# Patient Record
Sex: Female | Born: 1980 | Race: White | Hispanic: No | Marital: Married | Smoking: Never smoker
Health system: Southern US, Community
[De-identification: ages and names within clinical notes are randomized; demographics above are authoritative.]

## PROBLEM LIST (undated history)

## (undated) DIAGNOSIS — R519 Headache, unspecified: Secondary | ICD-10-CM

## (undated) DIAGNOSIS — G43909 Migraine, unspecified, not intractable, without status migrainosus: Secondary | ICD-10-CM

## (undated) DIAGNOSIS — G8929 Other chronic pain: Secondary | ICD-10-CM

## (undated) DIAGNOSIS — D649 Anemia, unspecified: Secondary | ICD-10-CM

## (undated) DIAGNOSIS — T4145XA Adverse effect of unspecified anesthetic, initial encounter: Secondary | ICD-10-CM

## (undated) DIAGNOSIS — F419 Anxiety disorder, unspecified: Secondary | ICD-10-CM

## (undated) DIAGNOSIS — M545 Low back pain, unspecified: Secondary | ICD-10-CM

## (undated) DIAGNOSIS — J189 Pneumonia, unspecified organism: Secondary | ICD-10-CM

## (undated) DIAGNOSIS — R51 Headache: Secondary | ICD-10-CM

## (undated) DIAGNOSIS — I10 Essential (primary) hypertension: Secondary | ICD-10-CM

## (undated) DIAGNOSIS — T8859XA Other complications of anesthesia, initial encounter: Secondary | ICD-10-CM

## (undated) HISTORY — PX: REFRACTIVE SURGERY: SHX103

---

## 2015-03-13 ENCOUNTER — Emergency Department: Admission: EM | Admit: 2015-03-13 | Discharge: 2015-03-13 | Payer: 59 | Source: Home / Self Care

## 2015-03-13 ENCOUNTER — Encounter (HOSPITAL_BASED_OUTPATIENT_CLINIC_OR_DEPARTMENT_OTHER): Payer: Self-pay

## 2015-03-13 ENCOUNTER — Emergency Department (HOSPITAL_BASED_OUTPATIENT_CLINIC_OR_DEPARTMENT_OTHER): Payer: 59

## 2015-03-13 ENCOUNTER — Inpatient Hospital Stay (HOSPITAL_BASED_OUTPATIENT_CLINIC_OR_DEPARTMENT_OTHER)
Admission: EM | Admit: 2015-03-13 | Discharge: 2015-03-16 | DRG: 871 | Disposition: A | Discharge status: Home / Self Care | Payer: 59 | Attending: Internal Medicine | Admitting: Internal Medicine

## 2015-03-13 DIAGNOSIS — R1110 Vomiting, unspecified: Secondary | ICD-10-CM

## 2015-03-13 DIAGNOSIS — Z882 Allergy status to sulfonamides status: Secondary | ICD-10-CM

## 2015-03-13 DIAGNOSIS — A419 Sepsis, unspecified organism: Secondary | ICD-10-CM | POA: Diagnosis present

## 2015-03-13 DIAGNOSIS — J189 Pneumonia, unspecified organism: Secondary | ICD-10-CM | POA: Diagnosis present

## 2015-03-13 DIAGNOSIS — J159 Unspecified bacterial pneumonia: Secondary | ICD-10-CM | POA: Diagnosis present

## 2015-03-13 DIAGNOSIS — I10 Essential (primary) hypertension: Secondary | ICD-10-CM | POA: Diagnosis present

## 2015-03-13 DIAGNOSIS — R197 Diarrhea, unspecified: Secondary | ICD-10-CM | POA: Diagnosis present

## 2015-03-13 DIAGNOSIS — E876 Hypokalemia: Secondary | ICD-10-CM | POA: Diagnosis not present

## 2015-03-13 DIAGNOSIS — Z79899 Other long term (current) drug therapy: Secondary | ICD-10-CM

## 2015-03-13 DIAGNOSIS — B349 Viral infection, unspecified: Secondary | ICD-10-CM | POA: Diagnosis present

## 2015-03-13 DIAGNOSIS — R112 Nausea with vomiting, unspecified: Secondary | ICD-10-CM | POA: Diagnosis present

## 2015-03-13 HISTORY — DX: Headache: R51

## 2015-03-13 HISTORY — DX: Anxiety disorder, unspecified: F41.9

## 2015-03-13 HISTORY — DX: Other complications of anesthesia, initial encounter: T88.59XA

## 2015-03-13 HISTORY — DX: Pneumonia, unspecified organism: J18.9

## 2015-03-13 HISTORY — DX: Migraine, unspecified, not intractable, without status migrainosus: G43.909

## 2015-03-13 HISTORY — DX: Low back pain, unspecified: M54.50

## 2015-03-13 HISTORY — DX: Headache, unspecified: R51.9

## 2015-03-13 HISTORY — DX: Low back pain: M54.5

## 2015-03-13 HISTORY — DX: Essential (primary) hypertension: I10

## 2015-03-13 HISTORY — DX: Adverse effect of unspecified anesthetic, initial encounter: T41.45XA

## 2015-03-13 HISTORY — DX: Anemia, unspecified: D64.9

## 2015-03-13 HISTORY — DX: Other chronic pain: G89.29

## 2015-03-13 LAB — COMPREHENSIVE METABOLIC PANEL
ALT: 22 U/L (ref 14–54)
AST: 47 U/L — ABNORMAL HIGH (ref 15–41)
Albumin: 3.7 g/dL (ref 3.5–5.0)
Alkaline Phosphatase: 85 U/L (ref 38–126)
Anion gap: 13 (ref 5–15)
BUN: 13 mg/dL (ref 6–20)
CO2: 23 mmol/L (ref 22–32)
CREATININE: 0.94 mg/dL (ref 0.44–1.00)
Calcium: 9 mg/dL (ref 8.9–10.3)
Chloride: 101 mmol/L (ref 101–111)
GFR calc Af Amer: >60 mL/min (ref >60)
GFR calc non Af Amer: >60 mL/min (ref >60)
GLUCOSE: 107 mg/dL — AB (ref 65–99)
POTASSIUM: 3.1 mmol/L — AB (ref 3.5–5.1)
SODIUM: 137 mmol/L (ref 135–145)
Total Bilirubin: 0.4 mg/dL (ref 0.3–1.2)
Total Protein: 8 g/dL (ref 6.5–8.1)

## 2015-03-13 LAB — CBC WITH DIFFERENTIAL/PLATELET
Basophils Absolute: 0 10*3/uL (ref 0.0–0.1)
Basophils Relative: 0 % (ref 0–1)
Eosinophils Absolute: 0 10*3/uL (ref 0.0–0.7)
Eosinophils Relative: 0 % (ref 0–5)
HEMATOCRIT: 41.6 % (ref 36.0–46.0)
Hemoglobin: 13.6 g/dL (ref 12.0–15.0)
LYMPHS PCT: 15 % (ref 12–46)
Lymphs Abs: 1.2 10*3/uL (ref 0.7–4.0)
MCH: 29.4 pg (ref 26.0–34.0)
MCHC: 32.7 g/dL (ref 30.0–36.0)
MCV: 90 fL (ref 78.0–100.0)
MONOS PCT: 6 % (ref 3–12)
Monocytes Absolute: 0.5 10*3/uL (ref 0.1–1.0)
Neutro Abs: 6.3 10*3/uL (ref 1.7–7.7)
Neutrophils Relative %: 79 % — ABNORMAL HIGH (ref 43–77)
PLATELETS: 331 10*3/uL (ref 150–400)
RBC: 4.62 MIL/uL (ref 3.87–5.11)
RDW: 13.5 % (ref 11.5–15.5)
WBC: 8 10*3/uL (ref 4.0–10.5)

## 2015-03-13 LAB — URINALYSIS, ROUTINE W REFLEX MICROSCOPIC
Bilirubin Urine: NEGATIVE
Glucose, UA: NEGATIVE mg/dL
Hgb urine dipstick: NEGATIVE
Ketones, ur: NEGATIVE mg/dL
Leukocytes, UA: NEGATIVE
Nitrite: NEGATIVE
PROTEIN: 30 mg/dL — AB
SPECIFIC GRAVITY, URINE: 1.013 (ref 1.005–1.030)
Urobilinogen, UA: 0.2 mg/dL (ref 0.0–1.0)
pH: 6.5 (ref 5.0–8.0)

## 2015-03-13 LAB — URINE MICROSCOPIC-ADD ON

## 2015-03-13 LAB — I-STAT CG4 LACTIC ACID, ED: LACTIC ACID, VENOUS: 1.76 mmol/L (ref 0.5–2.0)

## 2015-03-13 LAB — PREGNANCY, URINE: PREG TEST UR: NEGATIVE

## 2015-03-13 LAB — LIPASE, BLOOD: Lipase: 11 U/L — ABNORMAL LOW (ref 22–51)

## 2015-03-13 MED ORDER — SODIUM CHLORIDE 0.9 % IV SOLN
1000.0000 mL | INTRAVENOUS | Status: DC
  Administered 2015-03-13 – 2015-03-14 (×2): 1000 mL via INTRAVENOUS

## 2015-03-13 MED ORDER — DEXTROSE 5 % IV SOLN
1.0000 g | Freq: Once | INTRAVENOUS | Status: AC
  Administered 2015-03-13: 1 g via INTRAVENOUS

## 2015-03-13 MED ORDER — PROMETHAZINE HCL 25 MG/ML IJ SOLN
12.5000 mg | Freq: Once | INTRAMUSCULAR | Status: AC
  Administered 2015-03-13: 12.5 mg via INTRAVENOUS
  Filled 2015-03-13: qty 1

## 2015-03-13 MED ORDER — ONDANSETRON HCL 4 MG/2ML IJ SOLN: 4.0000 mg | Freq: Once | INTRAMUSCULAR | Status: AC

## 2015-03-13 MED ORDER — CARISOPRODOL 350 MG PO TABS: 350.0000 mg | ORAL_TABLET | Freq: Four times a day (QID) | ORAL | Status: DC | PRN

## 2015-03-13 MED ORDER — HEPARIN SODIUM (PORCINE) 5000 UNIT/ML IJ SOLN
5000.0000 [IU] | Freq: Three times a day (TID) | INTRAMUSCULAR | Status: DC
Start: 2015-03-13 — End: 2015-03-16
  Administered 2015-03-13 – 2015-03-16 (×8): 5000 [IU] via SUBCUTANEOUS
  Filled 2015-03-13 (×7): qty 1

## 2015-03-13 MED ORDER — SERTRALINE HCL 100 MG PO TABS
100.0000 mg | ORAL_TABLET | Freq: Every day | ORAL | Status: DC
  Administered 2015-03-14 – 2015-03-15 (×2): 100 mg via ORAL
  Filled 2015-03-13 (×4): qty 1

## 2015-03-13 MED ORDER — PROPRANOLOL HCL 10 MG PO TABS
10.0000 mg | ORAL_TABLET | Freq: Three times a day (TID) | ORAL | Status: DC
  Administered 2015-03-13 – 2015-03-15 (×7): 10 mg via ORAL
  Filled 2015-03-13 (×13): qty 1

## 2015-03-13 MED ORDER — ONDANSETRON HCL 4 MG/2ML IJ SOLN
4.0000 mg | Freq: Four times a day (QID) | INTRAMUSCULAR | Status: DC | PRN
  Administered 2015-03-13 – 2015-03-14 (×2): 4 mg via INTRAVENOUS
  Filled 2015-03-13 (×2): qty 2

## 2015-03-13 MED ORDER — SODIUM CHLORIDE 0.9 % IV SOLN
1000.0000 mL | Freq: Once | INTRAVENOUS | Status: AC
  Administered 2015-03-13: 1000 mL via INTRAVENOUS

## 2015-03-13 MED ORDER — AZITHROMYCIN 500 MG IV SOLR
INTRAVENOUS | Status: AC
Start: 2015-03-13 — End: 2015-03-13
  Filled 2015-03-13: qty 500

## 2015-03-13 MED ORDER — DEXTROSE 5 % IV SOLN
500.0000 mg | Freq: Once | INTRAVENOUS | Status: AC
  Administered 2015-03-13: 500 mg via INTRAVENOUS

## 2015-03-13 MED ORDER — ACETAMINOPHEN 500 MG PO TABS
1000.0000 mg | ORAL_TABLET | Freq: Once | ORAL | Status: AC
  Administered 2015-03-13: 1000 mg via ORAL
  Filled 2015-03-13: qty 2

## 2015-03-13 MED ORDER — CEFTRIAXONE SODIUM 1 G IJ SOLR
INTRAMUSCULAR | Status: AC
  Administered 2015-03-13: 1000 mg
  Filled 2015-03-13: qty 10

## 2015-03-13 MED ORDER — CEFTRIAXONE SODIUM IN DEXTROSE 20 MG/ML IV SOLN
1.0000 g | Freq: Once | INTRAVENOUS | Status: AC
  Administered 2015-03-14: 1 g via INTRAVENOUS
  Filled 2015-03-13: qty 50

## 2015-03-13 MED ORDER — SODIUM CHLORIDE 0.9 % IV SOLN
1000.0000 mL | Freq: Once | INTRAVENOUS | Status: AC
Start: 2015-03-13 — End: 2015-03-13
  Administered 2015-03-13: 1000 mL via INTRAVENOUS

## 2015-03-13 MED ORDER — ELETRIPTAN HYDROBROMIDE 20 MG PO TABS
20.0000 mg | ORAL_TABLET | ORAL | Status: DC | PRN
  Administered 2015-03-14 – 2015-03-16 (×2): 20 mg via ORAL
  Filled 2015-03-13 (×4): qty 1

## 2015-03-13 MED ORDER — PROMETHAZINE HCL 25 MG/ML IJ SOLN
25.0000 mg | Freq: Four times a day (QID) | INTRAMUSCULAR | Status: DC | PRN
Start: 2015-03-13 — End: 2015-03-13

## 2015-03-13 MED ORDER — ONDANSETRON HCL 4 MG/2ML IJ SOLN
4.0000 mg | Freq: Once | INTRAMUSCULAR | Status: AC
Start: 2015-03-13 — End: 2015-03-13
  Administered 2015-03-13: 4 mg via INTRAVENOUS
  Filled 2015-03-13: qty 2

## 2015-03-13 MED ORDER — PROMETHAZINE HCL 25 MG PO TABS: 25.0000 mg | ORAL_TABLET | Freq: Four times a day (QID) | ORAL | Status: DC | PRN

## 2015-03-13 MED ORDER — ACETAMINOPHEN 325 MG PO TABS
650.0000 mg | ORAL_TABLET | Freq: Four times a day (QID) | ORAL | Status: DC | PRN
  Administered 2015-03-13 – 2015-03-14 (×3): 650 mg via ORAL
  Filled 2015-03-13 (×3): qty 2

## 2015-03-13 MED ORDER — DEXTROSE 5 % IV SOLN
500.0000 mg | INTRAVENOUS | Status: DC
  Administered 2015-03-14 – 2015-03-15 (×2): 500 mg via INTRAVENOUS
  Filled 2015-03-13 (×2): qty 500

## 2015-03-13 MED ORDER — ONDANSETRON HCL 4 MG/2ML IJ SOLN
INTRAMUSCULAR | Status: AC
  Filled 2015-03-13: qty 2

## 2015-03-13 NOTE — H&P (Signed)
[facility] History and Physical  Helen Sanchez JXB:[id number] DOB: [birth year] DOA: 03/13/2015  Referring physician: EDP PCP: No PCP Per Patient   Chief Complaint: Viral syndrome   HPI: Helen Sanchez is a 34 y.o. female who presents to the ED with c/o cough, fever, chills, N/V/D, and fall.  Patient has been feeling ill for past 4 days.  Her husband and child are also ill with similar symptoms.  Her family just returned from visiting relatives up in [place] who also were all ill as well.  Having numerous episodes of vomiting and diarrhea.  Dosent think any of this is food related as her son who is also ill dosent eat same foods as she and husband.  Review of Systems: Systems reviewed.  As above, otherwise negative  Past Medical History  Diagnosis Date  . Migraine   . Hypertension    Past Surgical History  Procedure Laterality Date  . Refractive surgery     Social History:  reports that she has never smoked. She does not have any smokeless tobacco history on file. She reports that she does not drink alcohol or use illicit drugs.  Allergies  Allergen Reactions  . Sulfa Antibiotics Rash    No family history on file.   Prior to Admission medications   Medication Sig Start Date End Date Taking? Authorizing Provider  carisoprodol (SOMA) 350 MG tablet Take 350 mg by mouth 4 (four) times daily as needed for muscle spasms.   Yes Historical Provider, MD  eletriptan (RELPAX) 20 MG tablet Take 20 mg by mouth as needed for migraine or headache. May repeat in 2 hours if headache persists or recurs.   Yes Historical Provider, MD  promethazine (PHENERGAN) 25 MG tablet Take 25 mg by mouth every 6 (six) hours as needed for nausea or vomiting.   Yes Historical Provider, MD  propranolol (INDERAL) 10 MG tablet Take 10 mg by mouth 3 (three) times daily.   Yes Historical Provider, MD  sertraline (ZOLOFT) 100 MG tablet Take 100 mg by mouth daily.   Yes Historical Provider, MD   Physical  Exam: Filed Vitals:   03/13/15 2117  BP: 126/77  Pulse: 118  Temp: 99.8 F (37.7 C)  Resp: 18    BP 126/77 mmHg  Pulse 118  Temp(Src) 99.8 F (37.7 C) (Oral)  Resp 18  Ht 5\' 3"  (1.6 m)  Wt 65.7 kg (144 lb 13.5 oz)  BMI 25.66 kg/m2  SpO2 97%  LMP 02/13/2015  General Appearance:    Alert, oriented, no distress, appears stated age  Head:    Normocephalic, atraumatic  Eyes:    PERRL, EOMI, sclera non-icteric        Nose:   Nares without drainage or epistaxis. Mucosa, turbinates normal  Throat:   Moist mucous membranes. Oropharynx without erythema or exudate.  Neck:   Supple. No carotid bruits.  No thyromegaly.  No lymphadenopathy.   Back:     No CVA tenderness, no spinal tenderness  Lungs:     Has cough.  Chest wall:    No tenderness to palpitation  Heart:    Tachycardia  Abdomen:     Soft, non-tender, nondistended, normal bowel sounds, no organomegaly  Genitalia:    deferred  Rectal:    deferred  Extremities:   No clubbing, cyanosis or edema.  Pulses:   2+ and symmetric all extremities  Skin:   Urticarial rash on chest and back.  Lymph nodes:   Cervical, supraclavicular, and axillary nodes normal  Neurologic:   CNII-XII intact. Normal strength, sensation and reflexes      throughout    Labs on Admission:  Basic Metabolic Panel:  Recent Labs Lab 03/13/15 1630  NA 137  K 3.1*  CL 101  CO2 23  GLUCOSE 107*  BUN 13  CREATININE 0.94  CALCIUM 9.0   Liver Function Tests:  Recent Labs Lab 03/13/15 1630  AST 47*  ALT 22  ALKPHOS 85  BILITOT 0.4  PROT 8.0  ALBUMIN 3.7    Recent Labs Lab 03/13/15 1630  LIPASE 11*   No results for input(s): AMMONIA in the last 168 hours. CBC:  Recent Labs Lab 03/13/15 1630  WBC 8.0  NEUTROABS 6.3  HGB 13.6  HCT 41.6  MCV 90.0  PLT 331   Cardiac Enzymes: No results for input(s): CKTOTAL, CKMB, CKMBINDEX, TROPONINI in the last 168 hours.  BNP (last 3 results) No results for input(s): PROBNP in the last 8760  hours. CBG: No results for input(s): GLUCAP in the last 168 hours.  Radiological Exams on Admission: Dg Chest 2 View  03/13/2015   CLINICAL DATA:  Productive cough and congestion.  Fever.  EXAM: CHEST  2 VIEW  COMPARISON:  None.  FINDINGS: There is consolidation in portions of the anterior segment of the right upper lobe and in the superior segment right lower lobe. Lungs elsewhere clear. Heart size and pulmonary vascularity are normal. No adenopathy. No bone lesions.  IMPRESSION: Areas of airspace consolidation consistent with pneumonia in the anterior segment right upper lobe and in the superior segment right lower lobe.   Electronically Signed   By: Bretta Bang III M.D.   On: 03/13/2015 17:17    EKG: Independently reviewed.  Assessment/Plan Principal Problem:   CAP (community acquired pneumonia) Active Problems:   Nausea vomiting and diarrhea   Viral syndrome   Sepsis   1. CAP - could be part of the viral syndrome but will assume that this is a secondary bacterial PNA in absence of evidence. 1. PNA pathway 2. Cultures pending 3. Respiratory virus panel pending 4. Rocephin and azithromycin empirically 2. Sepsis - 1. ABx as above 2. IVF 3. N/V/D 1. IVF 2. zofran for nausea    Code Status: Full  Family Communication: No family in room Disposition Plan: Admit to inpatient   Time spent: 70 min  GARDNER, JARED M. [facility] Pager [phone]  If 7AM-7PM, please contact the day team taking care of the patient [email or web address] Password [facility] 03/13/2015, 9:37 PM

## 2015-03-13 NOTE — ED Notes (Signed)
Pt reports 4 days ago tripped and fell into the corner of a dresser, states ? Loc b/c she does not remember husband picking her up.  States since this time with fever, n/v/d.  Reports has lost voice d/t vomiting.

## 2015-03-13 NOTE — ED Provider Notes (Signed)
CSN: [id number]     Arrival date & time 03/13/15  1456 History   First MD Initiated Contact with Patient 03/13/15 1545     Chief Complaint  Patient presents with  . Fall     (Consider location/radiation/quality/duration/timing/severity/associated sxs/prior Treatment) HPI Patient reports that she went to the urgent care because of flu symptoms. She reports while there she reported she had fallen 4 days ago and hit her head on the corner of a dresser. She reports she wanted to make sure that the cut she has on her forehead was okay. She reports a advised her she needed to come to the emergency department for further assessment. The fall from 4 days ago occurred due to a mechanical fall with tripping and hitting her head. She states that she might have had a brief loss of consciousness because she can't exactly recall her husband picking her up, however her mental status was normal after that and she knew all of her surroundings. She denies she's had significant headache. She reports she did not have concern for serious injury associated with that fall, she just wanted to make sure that her forehead was healing appropriately. She states that her main concern is that she's been sick with flulike symptoms for 4 days now. She assumed it was a viral flu illness because both her husband and her child have been ill as well. She states she's had multiple episodes of vomiting for the past 4 days. It seems to get worse at nighttime. She also states upwards of 4 episodes of diarrhea per day as well. She states she's had fever and association with this. She reports her voice is becoming hoarse from vomiting so much. She states she is still vomiting anywhere from 10-20 times a day since the onset of the illness. She denies associated abdominal pain. She did not think it was food related. She states her son does not eat any of the same foods that they do so she thought it was more likely to be a virus. Past Medical  History  Diagnosis Date  . Migraine   . Hypertension    Past Surgical History  Procedure Laterality Date  . Refractive surgery     No family history on file. History  Substance Use Topics  . Smoking status: Never Smoker   . Smokeless tobacco: Not on file  . Alcohol Use: No   OB History    No data available     Review of Systems 10 Systems reviewed and are negative for acute change except as noted in the HPI.    Allergies  Sulfa antibiotics  Home Medications   Prior to Admission medications   Medication Sig Start Date End Date Taking? Authorizing Provider  carisoprodol (SOMA) 350 MG tablet Take 350 mg by mouth 4 (four) times daily as needed for muscle spasms.   Yes Historical Provider, MD  eletriptan (RELPAX) 20 MG tablet Take 20 mg by mouth as needed for migraine or headache. May repeat in 2 hours if headache persists or recurs.   Yes Historical Provider, MD  promethazine (PHENERGAN) 25 MG tablet Take 25 mg by mouth every 6 (six) hours as needed for nausea or vomiting.   Yes Historical Provider, MD  propranolol (INDERAL) 10 MG tablet Take 10 mg by mouth 3 (three) times daily.   Yes Historical Provider, MD  sertraline (ZOLOFT) 100 MG tablet Take 100 mg by mouth daily.   Yes Historical Provider, MD   BP 113/61 mmHg  Pulse 126  Temp(Src) 102.3 F (39.1 C) (Oral)  Resp 18  Ht 5\' 3"  (1.6 m)  Wt 135 lb (61.236 kg)  BMI 23.92 kg/m2  SpO2 96%  LMP 02/13/2015 Physical Exam  Constitutional: She is oriented to person, place, and time. She appears well-developed and well-nourished.  Patient is alert and appropriate, GCS is 15. She appears mildly uncomfortable but is nontoxic and in no respiratory distress. She does have occasional cough.  HENT:  1 cm healing laceration to the right forehead that has a small flap appearance to it. Edges are well approximated. Associated erythema or swelling to suggest infection. No associated hematoma. Diffuse erythema tonsillar pillars. No  exudate.  Eyes: EOM are normal. Pupils are equal, round, and reactive to light. Right eye exhibits no discharge. Left eye exhibits no discharge. No scleral icterus.  Neck: Neck supple.  Cardiovascular: Regular rhythm, normal heart sounds and intact distal pulses.   Tachycardia.  Pulmonary/Chest: Effort normal and breath sounds normal. No stridor. No respiratory distress. She has no wheezes. She has no rales.  With deep inspiration patient has cough production.  Abdominal: Soft. Bowel sounds are normal. She exhibits no distension. There is no tenderness.  Musculoskeletal: Normal range of motion. She exhibits no edema or tenderness.  Lymphadenopathy:    She has no cervical adenopathy.  Neurological: She is alert and oriented to person, place, and time. She has normal strength. Coordination normal. GCS eye subscore is 4. GCS verbal subscore is 5. GCS motor subscore is 6.  Skin: Skin is warm, dry and intact.  Patient has an urticarial appearing rash on her chest and upper back. No other rash present.  Psychiatric: She has a normal mood and affect.    ED Course  Procedures (including critical care time) Labs Review Labs Reviewed  URINALYSIS, ROUTINE W REFLEX MICROSCOPIC (NOT AT [facility]) - Abnormal; Notable for the following:    Protein, ur 30 (*)    All other components within normal limits  URINE MICROSCOPIC-ADD ON - Abnormal; Notable for the following:    Bacteria, UA MANY (*)    All other components within normal limits  COMPREHENSIVE METABOLIC PANEL - Abnormal; Notable for the following:    Potassium 3.1 (*)    Glucose, Bld 107 (*)    AST 47 (*)    All other components within normal limits  LIPASE, BLOOD - Abnormal; Notable for the following:    Lipase 11 (*)    All other components within normal limits  CBC WITH DIFFERENTIAL/PLATELET - Abnormal; Notable for the following:    Neutrophils Relative % 79 (*)    All other components within normal limits  CULTURE, BLOOD (ROUTINE X 2)   CULTURE, BLOOD (ROUTINE X 2)  PREGNANCY, URINE  GI PATHOGEN PANEL BY PCR, STOOL  I-STAT CG4 LACTIC ACID, ED    Imaging Review Dg Chest 2 View  03/13/2015   CLINICAL DATA:  Productive cough and congestion.  Fever.  EXAM: CHEST  2 VIEW  COMPARISON:  None.  FINDINGS: There is consolidation in portions of the anterior segment of the right upper lobe and in the superior segment right lower lobe. Lungs elsewhere clear. Heart size and pulmonary vascularity are normal. No adenopathy. No bone lesions.  IMPRESSION: Areas of airspace consolidation consistent with pneumonia in the anterior segment right upper lobe and in the superior segment right lower lobe.   Electronically Signed   By: Bretta Bang III M.D.   On: 03/13/2015 17:17  EKG Interpretation None      MDM   Final diagnoses:  Community acquired pneumonia  Intractable vomiting with nausea, vomiting of unspecified type   Patient presents with vomiting for 4 days duration. She has also had fever. During physical examination she was noted to have harsh cough as well. Chest x-ray shows a consolidated area of pneumonia. At this time with ongoing vomiting, tachycardia and fever the patient will be admitted for inpatient treatment.    Arby Barrette, MD 03/13/15 [phone]

## 2015-03-13 NOTE — ED Notes (Signed)
Pt is nauseated.

## 2015-03-13 NOTE — ED Notes (Signed)
Encouraged pt. If she needs to use restroom for BM to call RN due to need for Stool sample

## 2015-03-14 DIAGNOSIS — R1110 Vomiting, unspecified: Secondary | ICD-10-CM

## 2015-03-14 DIAGNOSIS — R112 Nausea with vomiting, unspecified: Secondary | ICD-10-CM | POA: Insufficient documentation

## 2015-03-14 DIAGNOSIS — E876 Hypokalemia: Secondary | ICD-10-CM | POA: Diagnosis present

## 2015-03-14 DIAGNOSIS — A419 Sepsis, unspecified organism: Principal | ICD-10-CM

## 2015-03-14 DIAGNOSIS — J189 Pneumonia, unspecified organism: Secondary | ICD-10-CM

## 2015-03-14 LAB — CBC WITH DIFFERENTIAL/PLATELET
BASOS ABS: 0 10*3/uL (ref 0.0–0.1)
Basophils Relative: 0 % (ref 0–1)
Eosinophils Absolute: 0 10*3/uL (ref 0.0–0.7)
Eosinophils Relative: 0 % (ref 0–5)
HCT: 36 % (ref 36.0–46.0)
HEMOGLOBIN: 11.5 g/dL — AB (ref 12.0–15.0)
LYMPHS ABS: 1.3 10*3/uL (ref 0.7–4.0)
Lymphocytes Relative: 32 % (ref 12–46)
MCH: 28.8 pg (ref 26.0–34.0)
MCHC: 31.9 g/dL (ref 30.0–36.0)
MCV: 90 fL (ref 78.0–100.0)
Monocytes Absolute: 0.3 10*3/uL (ref 0.1–1.0)
Monocytes Relative: 7 % (ref 3–12)
NEUTROS ABS: 2.5 10*3/uL (ref 1.7–7.7)
Neutrophils Relative %: 61 % (ref 43–77)
Platelets: 302 10*3/uL (ref 150–400)
RBC: 4 MIL/uL (ref 3.87–5.11)
RDW: 13.7 % (ref 11.5–15.5)
WBC: 4.1 10*3/uL (ref 4.0–10.5)

## 2015-03-14 LAB — MAGNESIUM: Magnesium: 1.7 mg/dL (ref 1.7–2.4)

## 2015-03-14 LAB — COMPREHENSIVE METABOLIC PANEL
ALBUMIN: 2.5 g/dL — AB (ref 3.5–5.0)
ALT: 13 U/L — ABNORMAL LOW (ref 14–54)
AST: 28 U/L (ref 15–41)
Alkaline Phosphatase: 63 U/L (ref 38–126)
Anion gap: 8 (ref 5–15)
BILIRUBIN TOTAL: 0.3 mg/dL (ref 0.3–1.2)
BUN: 7 mg/dL (ref 6–20)
CO2: 19 mmol/L — ABNORMAL LOW (ref 22–32)
CREATININE: 0.95 mg/dL (ref 0.44–1.00)
Calcium: 7.5 mg/dL — ABNORMAL LOW (ref 8.9–10.3)
Chloride: 112 mmol/L — ABNORMAL HIGH (ref 101–111)
GFR calc Af Amer: >60 mL/min (ref >60)
GFR calc non Af Amer: >60 mL/min (ref >60)
Glucose, Bld: 135 mg/dL — ABNORMAL HIGH (ref 65–99)
POTASSIUM: 2.2 mmol/L — AB (ref 3.5–5.1)
Sodium: 139 mmol/L (ref 135–145)
Total Protein: 5.7 g/dL — ABNORMAL LOW (ref 6.5–8.1)

## 2015-03-14 LAB — INFLUENZA PANEL BY PCR (TYPE A & B)
H1N1FLUPCR: NOT DETECTED
INFLBPCR: NEGATIVE
Influenza A By PCR: NEGATIVE

## 2015-03-14 LAB — PROCALCITONIN: Procalcitonin: 5.98 ng/mL

## 2015-03-14 LAB — APTT: aPTT: 43 seconds — ABNORMAL HIGH (ref 24–37)

## 2015-03-14 LAB — PROTIME-INR
INR: 1.14 (ref 0.00–1.49)
Prothrombin Time: 14.8 seconds (ref 11.6–15.2)

## 2015-03-14 LAB — LACTIC ACID, PLASMA
Lactic Acid, Venous: 0.8 mmol/L (ref 0.5–2.0)
Lactic Acid, Venous: 1.6 mmol/L (ref 0.5–2.0)

## 2015-03-14 LAB — STREP PNEUMONIAE URINARY ANTIGEN: STREP PNEUMO URINARY ANTIGEN: NEGATIVE

## 2015-03-14 LAB — HIV ANTIBODY (ROUTINE TESTING W REFLEX): HIV Screen 4th Generation wRfx: NONREACTIVE

## 2015-03-14 MED ORDER — SODIUM CHLORIDE 0.9 % IV SOLN
INTRAVENOUS | Status: DC
  Administered 2015-03-14: 12:00:00 via INTRAVENOUS
  Filled 2015-03-14 (×5): qty 1000

## 2015-03-14 MED ORDER — MAGNESIUM SULFATE 2 GM/50ML IV SOLN
2.0000 g | Freq: Once | INTRAVENOUS | Status: AC
  Administered 2015-03-14: 2 g via INTRAVENOUS
  Filled 2015-03-14: qty 50

## 2015-03-14 MED ORDER — SODIUM CHLORIDE 0.9 % IV BOLUS (SEPSIS): 1000.0000 mL | INTRAVENOUS | Status: AC

## 2015-03-14 MED ORDER — GUAIFENESIN ER 600 MG PO TB12
1200.0000 mg | ORAL_TABLET | Freq: Two times a day (BID) | ORAL | Status: DC
  Administered 2015-03-14 – 2015-03-16 (×4): 1200 mg via ORAL
  Filled 2015-03-14 (×4): qty 2

## 2015-03-14 MED ORDER — LEVALBUTEROL HCL 0.63 MG/3ML IN NEBU: 0.6300 mg | INHALATION_SOLUTION | RESPIRATORY_TRACT | Status: DC | PRN

## 2015-03-14 MED ORDER — LEVALBUTEROL HCL 0.63 MG/3ML IN NEBU
0.6300 mg | INHALATION_SOLUTION | Freq: Four times a day (QID) | RESPIRATORY_TRACT | Status: DC
  Administered 2015-03-14: 0.63 mg via RESPIRATORY_TRACT
  Filled 2015-03-14: qty 3

## 2015-03-14 MED ORDER — POTASSIUM CHLORIDE CRYS ER 20 MEQ PO TBCR
40.0000 meq | EXTENDED_RELEASE_TABLET | ORAL | Status: AC
  Administered 2015-03-14 (×3): 40 meq via ORAL
  Filled 2015-03-14 (×3): qty 2

## 2015-03-14 NOTE — Progress Notes (Signed)
03/14/15 Patient has low K+ at 2.2 informed Physician for orders.

## 2015-03-14 NOTE — Progress Notes (Signed)
[facility] PROGRESS NOTE  Matilyn Fehrman ZOX:[id number] DOB: May 25, 1981 DOA: 03/13/2015 PCP: Marvis Repress, MD  Assessment/Plan: #1 sepsis secondary to community-acquired pneumonia Patient presented septic meeting criteria with fevers as high as 103.2, tachycardic up to 132, with a chest x-ray consistent with right-sided pneumonia. Patient with some clinical improvement. Patient still with fevers. Patient has been pancultured and cultures are pending. Influenza PCR is pending. Continue empiric IV Rocephin, IV azithromycin, oxygen. Will place on Mucinex and bronchodilators.  #2 community acquired pneumonia Sputum Gram stain and cultures pending. Blood cultures are pending. Patient with fevers. Continue oxygen, empiric IV Rocephin and IV azithromycin, oxygen. Will add Mucinex and broncho-dilators. Follow.  #3 hypokalemia Secondary to GI losses. Check a magnesium level. Replete.  #4 nausea vomiting diarrhea Patient with no further diarrhea. Clinical improvement. Continue IV fluids. Continue supportive care.  #5 prophylaxis Heparin for DVT prophylaxis.   Code Status: Full Family Communication: Updated patient. No family at bedside. Disposition Plan: Home when medically stable and afebrile greater than 24-48 hours and tolerating oral intake.   Consultants:  None  Procedures:  Chest x-ray 03/13/2015  Antibiotics:  IV Rocephin 03/13/2015  IV azithromycin 03/13/2015  HPI/Subjective: Patient denies any further emesis. Patient states feeling a little bit better than on admission. Cough improving. Patient with some nausea. Patient tolerating clear liquids.  Objective: Filed Vitals:   03/14/15 0720  BP:   Pulse:   Temp: 98.8 F (37.1 C)  Resp:     Intake/Output Summary (Last 24 hours) at 03/14/15 1208 Last data filed at 03/14/15 0600  Gross per 24 hour  Intake 5682.25 ml  Output   1200 ml  Net 4482.25 ml   Filed Weights   03/13/15 1504 03/13/15 2117  Weight:  61.236 kg (135 lb) 65.7 kg (144 lb 13.5 oz)    Exam:   General:  NAD  Cardiovascular: RRR  Respiratory: Coarse/Rhonchorus BS right greater than left  Abdomen: Soft, nontender, nondistended, positive bowel sounds.  Musculoskeletal: No clubbing cyanosis or edema.  Data Reviewed: Basic Metabolic Panel:  Recent Labs Lab 03/13/15 1630 03/14/15 0926  NA 137 139  K 3.1* 2.2*  CL 101 112*  CO2 23 19*  GLUCOSE 107* 135*  BUN 13 7  CREATININE 0.94 0.95  CALCIUM 9.0 7.5*  MG  --  1.7   Liver Function Tests:  Recent Labs Lab 03/13/15 1630 03/14/15 0926  AST 47* 28  ALT 22 13*  ALKPHOS 85 63  BILITOT 0.4 0.3  PROT 8.0 5.7*  ALBUMIN 3.7 2.5*    Recent Labs Lab 03/13/15 1630  LIPASE 11*   No results for input(s): AMMONIA in the last 168 hours. CBC:  Recent Labs Lab 03/13/15 1630 03/14/15 0926  WBC 8.0 4.1  NEUTROABS 6.3 2.5  HGB 13.6 11.5*  HCT 41.6 36.0  MCV 90.0 90.0  PLT 331 302   Cardiac Enzymes: No results for input(s): CKTOTAL, CKMB, CKMBINDEX, TROPONINI in the last 168 hours. BNP (last 3 results) No results for input(s): BNP in the last 8760 hours.  ProBNP (last 3 results) No results for input(s): PROBNP in the last 8760 hours.  CBG: No results for input(s): GLUCAP in the last 168 hours.  Recent Results (from the past 240 hour(s))  Culture, blood (routine x 2)     Status: None (Preliminary result)   Collection Time: 03/13/15  4:35 PM  Result Value Ref Range Status   Specimen Description BLOOD LEFT [facility]  Final   Special Requests BOTTLES DRAWN AEROBIC AND  ANAEROBIC [facility] EACH  Final   Culture   Final    NO GROWTH < 12 HOURS Performed at [facility]    Report Status PENDING  Incomplete  Culture, blood (routine x 2)     Status: None (Preliminary result)   Collection Time: 03/13/15  6:40 PM  Result Value Ref Range Status   Specimen Description BLOOD RIGHT [facility]  Final   Special Requests BOTTLES DRAWN AEROBIC AND ANAEROBIC 4CC EACH  Final    Culture   Final    NO GROWTH < 12 HOURS Performed at [facility]    Report Status PENDING  Incomplete     Studies: Dg Chest 2 View  03/13/2015   CLINICAL DATA:  Productive cough and congestion.  Fever.  EXAM: CHEST  2 VIEW  COMPARISON:  None.  FINDINGS: There is consolidation in portions of the anterior segment of the right upper lobe and in the superior segment right lower lobe. Lungs elsewhere clear. Heart size and pulmonary vascularity are normal. No adenopathy. No bone lesions.  IMPRESSION: Areas of airspace consolidation consistent with pneumonia in the anterior segment right upper lobe and in the superior segment right lower lobe.   Electronically Signed   By: Bretta Bang III M.D.   On: 03/13/2015 17:17    Scheduled Meds: . azithromycin (ZITHROMAX) 500 MG IVPB  500 mg Intravenous Q24H  . cefTRIAXone (ROCEPHIN)  IV  1 g Intravenous Once  . heparin  5,000 Units Subcutaneous 3 times per day  . magnesium sulfate 1 - 4 g bolus IVPB  2 g Intravenous Once  . potassium chloride  40 mEq Oral Q4H  . propranolol  10 mg Oral TID  . sertraline  100 mg Oral Daily  . sodium chloride  1,000 mL Intravenous Q1H   Continuous Infusions: . sodium chloride 0.9 % 1,000 mL with potassium chloride 40 mEq infusion 125 mL/hr at 03/14/15 1139    Principal Problem:   Sepsis Active Problems:   CAP (community acquired pneumonia)   Nausea vomiting and diarrhea   Viral syndrome   Hypokalemia    Time spent: 40 mins    [facility] MD [facility] Pager [phone]. If 7PM-7AM, please contact night-coverage at [email or web address], password [facility] 03/14/2015, 12:08 PM  LOS: 1 day

## 2015-03-15 DIAGNOSIS — R112 Nausea with vomiting, unspecified: Secondary | ICD-10-CM

## 2015-03-15 DIAGNOSIS — R197 Diarrhea, unspecified: Secondary | ICD-10-CM

## 2015-03-15 LAB — CBC
HCT: 34.1 % — ABNORMAL LOW (ref 36.0–46.0)
Hemoglobin: 10.9 g/dL — ABNORMAL LOW (ref 12.0–15.0)
MCH: 29.3 pg (ref 26.0–34.0)
MCHC: 32 g/dL (ref 30.0–36.0)
MCV: 91.7 fL (ref 78.0–100.0)
Platelets: 312 10*3/uL (ref 150–400)
RBC: 3.72 MIL/uL — ABNORMAL LOW (ref 3.87–5.11)
RDW: 13.8 % (ref 11.5–15.5)
WBC: 2.8 10*3/uL — ABNORMAL LOW (ref 4.0–10.5)

## 2015-03-15 LAB — BASIC METABOLIC PANEL
Anion gap: 5 (ref 5–15)
CALCIUM: 7.6 mg/dL — AB (ref 8.9–10.3)
CO2: 21 mmol/L — ABNORMAL LOW (ref 22–32)
Chloride: 113 mmol/L — ABNORMAL HIGH (ref 101–111)
Creatinine, Ser: 0.77 mg/dL (ref 0.44–1.00)
GFR calc Af Amer: >60 mL/min (ref >60)
GLUCOSE: 94 mg/dL (ref 65–99)
POTASSIUM: 4.9 mmol/L (ref 3.5–5.1)
SODIUM: 139 mmol/L (ref 135–145)

## 2015-03-15 LAB — RESPIRATORY VIRUS PANEL
Adenovirus: NEGATIVE
INFLUENZA B 1: NEGATIVE
Influenza A: NEGATIVE
METAPNEUMOVIRUS: NEGATIVE
Parainfluenza 1: NEGATIVE
Parainfluenza 2: NEGATIVE
Parainfluenza 3: NEGATIVE
Respiratory Syncytial Virus A: NEGATIVE
Respiratory Syncytial Virus B: NEGATIVE
Rhinovirus: NEGATIVE

## 2015-03-15 LAB — MAGNESIUM: MAGNESIUM: 2.3 mg/dL (ref 1.7–2.4)

## 2015-03-15 MED ORDER — SODIUM CHLORIDE 0.9 % IV SOLN: INTRAVENOUS | Status: DC

## 2015-03-15 MED ORDER — CEFTRIAXONE SODIUM IN DEXTROSE 20 MG/ML IV SOLN
1.0000 g | INTRAVENOUS | Status: DC
  Administered 2015-03-15: 1 g via INTRAVENOUS
  Filled 2015-03-15 (×2): qty 50

## 2015-03-15 NOTE — Progress Notes (Signed)
[facility] PROGRESS NOTE  Helen Sanchez ZOX:[id number] DOB: [birth year] DOA: 03/13/2015 PCP: Marvis Repress, MD  Assessment/Plan: #1 sepsis secondary to community-acquired pneumonia Patient presented septic meeting criteria with fevers as high as 103.2, tachycardic up to 132, with a chest x-ray consistent with right-sided pneumonia. Patient with some clinical improvement. Patient still with fevers however fever curve trending down. Patient has been pancultured and cultures are pending. Influenza PCR is negative. Continue empiric IV Rocephin, IV azithromycin, oxygen, Mucinex and bronchodilators.  #2 community acquired pneumonia Sputum Gram stain and cultures pending. Blood cultures are pending. Patient with fevers. Continue oxygen, empiric IV Rocephin and IV azithromycin, oxygen, Mucinex, broncho-dilators. Follow.  #3 hypokalemia Secondary to GI losses. Repleted.  #4 nausea vomiting diarrhea Patient with no further diarrhea. Clinical improvement. Continue IV fluids. Continue supportive care.  #5 prophylaxis Heparin for DVT prophylaxis.   Code Status: Full Family Communication: Updated patient. No family at bedside. Disposition Plan: Home when medically stable and afebrile greater than 24-48 hours and tolerating oral intake.   Consultants:  None  Procedures:  Chest x-ray 03/13/2015  Antibiotics:  IV Rocephin 03/13/2015  IV azithromycin 03/13/2015  HPI/Subjective: Patient denies any further emesis. Patient feeling better. Tolerating current diet. Still with cough.    Objective: Filed Vitals:   03/15/15 1647  BP: 118/79  Pulse: 115  Temp: 98.9 F (37.2 C)  Resp: 18    Intake/Output Summary (Last 24 hours) at 03/15/15 1844 Last data filed at 03/15/15 1757  Gross per 24 hour  Intake 3454.17 ml  Output    400 ml  Net 3054.17 ml   Filed Weights   03/13/15 1504 03/13/15 2117  Weight: 61.236 kg (135 lb) 65.7 kg (144 lb 13.5 oz)    Exam:   General:   NAD  Cardiovascular: RRR  Respiratory: Coarse/Rhonchorus BS right greater than left  Abdomen: Soft, nontender, nondistended, positive bowel sounds.  Musculoskeletal: No clubbing cyanosis or edema.  Data Reviewed: Basic Metabolic Panel:  Recent Labs Lab 03/13/15 1630 03/14/15 0926 03/15/15 0542 03/15/15 1023  NA 137 139 139  --   K 3.1* 2.2* 4.9  --   CL 101 112* 113*  --   CO2 23 19* 21*  --   GLUCOSE 107* 135* 94  --   BUN 13 7 <5*  --   CREATININE 0.94 0.95 0.77  --   CALCIUM 9.0 7.5* 7.6*  --   MG  --  1.7  --  2.3   Liver Function Tests:  Recent Labs Lab 03/13/15 1630 03/14/15 0926  AST 47* 28  ALT 22 13*  ALKPHOS 85 63  BILITOT 0.4 0.3  PROT 8.0 5.7*  ALBUMIN 3.7 2.5*    Recent Labs Lab 03/13/15 1630  LIPASE 11*   No results for input(s): AMMONIA in the last 168 hours. CBC:  Recent Labs Lab 03/13/15 1630 03/14/15 0926 03/15/15 0542  WBC 8.0 4.1 2.8*  NEUTROABS 6.3 2.5  --   HGB 13.6 11.5* 10.9*  HCT 41.6 36.0 34.1*  MCV 90.0 90.0 91.7  PLT 331 302 312   Cardiac Enzymes: No results for input(s): CKTOTAL, CKMB, CKMBINDEX, TROPONINI in the last 168 hours. BNP (last 3 results) No results for input(s): BNP in the last 8760 hours.  ProBNP (last 3 results) No results for input(s): PROBNP in the last 8760 hours.  CBG: No results for input(s): GLUCAP in the last 168 hours.  Recent Results (from the past 240 hour(s))  Culture, blood (routine x 2)  Status: None (Preliminary result)   Collection Time: 03/13/15  4:35 PM  Result Value Ref Range Status   Specimen Description BLOOD LEFT [facility]  Final   Special Requests BOTTLES DRAWN AEROBIC AND ANAEROBIC [facility] EACH  Final   Culture   Final    NO GROWTH 2 DAYS Performed at [facility]    Report Status PENDING  Incomplete  Culture, blood (routine x 2)     Status: None (Preliminary result)   Collection Time: 03/13/15  6:40 PM  Result Value Ref Range Status   Specimen Description BLOOD  RIGHT [facility]  Final   Special Requests BOTTLES DRAWN AEROBIC AND ANAEROBIC 4CC EACH  Final   Culture   Final    NO GROWTH 2 DAYS Performed at [facility]    Report Status PENDING  Incomplete     Studies: No results found.  Scheduled Meds: . azithromycin (ZITHROMAX) 500 MG IVPB  500 mg Intravenous Q24H  . cefTRIAXone (ROCEPHIN)  IV  1 g Intravenous Q24H  . guaiFENesin  1,200 mg Oral BID  . heparin  5,000 Units Subcutaneous 3 times per day  . propranolol  10 mg Oral TID  . sertraline  100 mg Oral Daily   Continuous Infusions:    Principal Problem:   Sepsis Active Problems:   CAP (community acquired pneumonia)   Nausea vomiting and diarrhea   Viral syndrome   Hypokalemia   Nausea with vomiting    Time spent: 40 mins    [facility] MD [facility] Pager [phone]. If 7PM-7AM, please contact night-coverage at [email or web address], password [facility] 03/15/2015, 6:44 PM  LOS: 2 days

## 2015-03-15 NOTE — Progress Notes (Signed)
Utilization Review Completed.Helen Sanchez T7/31/2016  

## 2015-03-16 LAB — BASIC METABOLIC PANEL WITH GFR
Anion gap: 5 (ref 5–15)
BUN: <5 mg/dL — ABNORMAL LOW (ref 6–20)
CO2: 22 mmol/L (ref 22–32)
Calcium: 8.2 mg/dL — ABNORMAL LOW (ref 8.9–10.3)
Chloride: 110 mmol/L (ref 101–111)
Creatinine, Ser: 0.84 mg/dL (ref 0.44–1.00)
GFR calc Af Amer: >60 mL/min
GFR calc non Af Amer: >60 mL/min
Glucose, Bld: 94 mg/dL (ref 65–99)
Potassium: 4.2 mmol/L (ref 3.5–5.1)
Sodium: 137 mmol/L (ref 135–145)

## 2015-03-16 LAB — CBC WITH DIFFERENTIAL/PLATELET
Basophils Absolute: 0 K/uL (ref 0.0–0.1)
Basophils Relative: 1 % (ref 0–1)
Eosinophils Absolute: 0 K/uL (ref 0.0–0.7)
Eosinophils Relative: 1 % (ref 0–5)
HCT: 36.9 % (ref 36.0–46.0)
Hemoglobin: 12 g/dL (ref 12.0–15.0)
Lymphocytes Relative: 64 % — ABNORMAL HIGH (ref 12–46)
Lymphs Abs: 1.5 K/uL (ref 0.7–4.0)
MCH: 29.6 pg (ref 26.0–34.0)
MCHC: 32.5 g/dL (ref 30.0–36.0)
MCV: 91.1 fL (ref 78.0–100.0)
Monocytes Absolute: 0.4 K/uL (ref 0.1–1.0)
Monocytes Relative: 15 % — ABNORMAL HIGH (ref 3–12)
Neutro Abs: 0.5 K/uL — ABNORMAL LOW (ref 1.7–7.7)
Neutrophils Relative %: 19 % — ABNORMAL LOW (ref 43–77)
Platelets: 406 K/uL — ABNORMAL HIGH (ref 150–400)
RBC: 4.05 MIL/uL (ref 3.87–5.11)
RDW: 13.7 % (ref 11.5–15.5)
WBC: 2.4 K/uL — ABNORMAL LOW (ref 4.0–10.5)

## 2015-03-16 LAB — LEGIONELLA ANTIGEN, URINE

## 2015-03-16 LAB — PATHOLOGIST SMEAR REVIEW: Path Review: REACTIVE

## 2015-03-16 LAB — MAGNESIUM: MAGNESIUM: 2.2 mg/dL (ref 1.7–2.4)

## 2015-03-16 MED ORDER — SODIUM CHLORIDE 0.9 % IV SOLN
INTRAVENOUS | Status: DC
  Administered 2015-03-16: 08:00:00 via INTRAVENOUS

## 2015-03-16 MED ORDER — LEVOFLOXACIN 750 MG PO TABS
750.0000 mg | ORAL_TABLET | Freq: Every day | ORAL | Status: DC
  Administered 2015-03-16: 750 mg via ORAL
  Filled 2015-03-16: qty 1

## 2015-03-16 MED ORDER — PROPRANOLOL HCL ER 80 MG PO CP24
80.0000 mg | ORAL_CAPSULE | Freq: Every day | ORAL | Status: DC
  Administered 2015-03-16: 80 mg via ORAL
  Filled 2015-03-16: qty 1

## 2015-03-16 MED ORDER — LEVOFLOXACIN 750 MG PO TABS: 750.0000 mg | ORAL_TABLET | Freq: Every day | ORAL | Status: AC

## 2015-03-16 MED ORDER — GUAIFENESIN ER 600 MG PO TB12: 1200.0000 mg | ORAL_TABLET | Freq: Two times a day (BID) | ORAL | Status: AC

## 2015-03-16 NOTE — Discharge Summary (Signed)
Physician Discharge Summary  Airiana Elman GNF:[id number] DOB: 01-02-1981 DOA: 03/13/2015  PCP: Ardelle Anton, MD  Admit date: 03/13/2015 Discharge date: 03/16/2015  Time spent: 65 minutes  Recommendations for Outpatient Follow-up:  1. Follow-up with SCHAEFFER,STANLEY, MD in 1 week. On follow-up patient will need a basic metabolic profile done to follow-up on electrolytes and renal function. Patient need a CBC done to follow-up on her counts.   Discharge Diagnoses:  Principal Problem:   Sepsis Active Problems:   CAP (community acquired pneumonia)   Nausea vomiting and diarrhea   Viral syndrome   Hypokalemia   Nausea with vomiting   Discharge Condition: Stable and improved  Diet recommendation: Regular  Filed Weights   03/13/15 1504 03/13/15 2117  Weight: 61.236 kg (135 lb) 65.7 kg (144 lb 13.5 oz)    History of present illness:  Helen Sanchez is a 34 y.o. female who presented to the ED with c/o cough, fever, chills, N/V/D, and fall. Patient had been feeling ill for past 4 days. Her husband and child are also ill with similar symptoms. Her family just returned from visiting relatives up in [place] who also were all ill as well.  Had numerous episodes of vomiting and diarrhea. Didnot think any of this was food related as her son who is also ill dosent eat same foods as she and husband.  Hospital Course:  #1 sepsis secondary to community-acquired pneumonia Patient was admitted with sepsis secondary to community-acquired pneumonia noted on chest x-ray. Patient met the criteria for sepsis with fevers as high as 103.2, tachycardic up to 132, with a chest x-ray consistent with right-sided pneumonia. Patient was admitted placed on IV fluids placed on empiric IV antibiotics and pancultured. Blood cultures had no growth to date by day of discharge. Influenza PCR which was done was negative. Patient was hydrated IV fluids. Patient was started on a diet which he tolerated. Patient  improved clinically and was transitioned from IV Rocephin and IV azithromycin to oral Levaquin. Patient be discharged home on 4 more days of oral Levaquin to complete a one-week course of antibiotic therapy. Patient be discharged in stable and improved condition.   #2 community acquired pneumonia Patient was admitted with sepsis chest x-ray which was done was consistent with a pneumonia in the anterior segment of the right upper lobe and superior segment of the right lower lobe. Patient was pancultured with no growth to date with the blood cultures and sputum cultures. Patient was placed empirically on IV Rocephin IV azithromycin as well as oxygen Mucinex and broncho-dilators. Patient improved clinically and was transitioned to oral Levaquin. Patient be discharged on 4 more days of oral Levaquin to complete a one-week course of antibiotic therapy. Patient will be discharged in stable and improved condition.   #3 hypokalemia Secondary to GI losses. Repleted.  #4 nausea vomiting diarrhea Patient with no further diarrhea, nausea or emesis. Patient was placed on IVF and supportive care. Patient was started on a diet which she tolerated. Patient was discharged in stable and improved condition.   Procedures:  Chest x-ray 03/13/2015    Consultations:  None  Discharge Exam: Filed Vitals:   03/16/15 0524  BP: 122/86  Pulse: 93  Temp: 97.3 F (36.3 C)  Resp: 16    General: NAD Cardiovascular: RRR Respiratory: CTAB  Discharge Instructions   Discharge Instructions    Diet general    Complete by:  As directed      Discharge instructions    Complete by:  As directed  Follow up with SCHAEFFER,STANLEY, MD in 1 week.     Increase activity slowly    Complete by:  As directed           Current Discharge Medication List    START taking these medications   Details  guaiFENesin (MUCINEX) 600 MG 12 hr tablet Take 2 tablets (1,200 mg total) by mouth 2 (two) times daily. Take for 4 days  then stop. Qty: 16 tablet, Refills: 0    levofloxacin (LEVAQUIN) 750 MG tablet Take 1 tablet (750 mg total) by mouth daily. Take for 4 days then stop. Qty: 4 tablet, Refills: 0      CONTINUE these medications which have NOT CHANGED   Details  acetaminophen (TYLENOL) 500 MG tablet Take 1,000 mg by mouth 3 (three) times daily as needed for mild pain.    ALPRAZolam (XANAX) 0.5 MG tablet Take 0.5 mg by mouth 3 (three) times daily as needed for anxiety.  Refills: 5    carisoprodol (SOMA) 350 MG tablet Take 350 mg by mouth 3 (three) times daily as needed for muscle spasms.     eletriptan (RELPAX) 40 MG tablet Take 40 mg by mouth daily as needed for migraine.     ibuprofen (ADVIL,MOTRIN) 200 MG tablet Take 400-800 mg by mouth 2 (two) times daily as needed for moderate pain.    loperamide (IMODIUM A-D) 2 MG tablet Take 2 mg by mouth every 6 (six) hours as needed for diarrhea or loose stools.    promethazine (PHENERGAN) 25 MG tablet Take 25 mg by mouth every 6 (six) hours as needed for nausea or vomiting.    propranolol ER (INDERAL LA) 80 MG 24 hr capsule Take 80 mg by mouth daily.    rizatriptan (MAXALT) 10 MG tablet Take 10 mg by mouth daily as needed for migraine. May repeat in 2 hours, if needed.    sertraline (ZOLOFT) 100 MG tablet Take 100 mg by mouth daily.    traMADol (ULTRAM) 50 MG tablet Take 50 mg by mouth every 6 (six) hours as needed for moderate pain.  Refills: 5       Allergies  Allergen Reactions  . Sulfa Antibiotics Rash and Nausea Only   Follow-up Information    Follow up with SCHAEFFER,STANLEY, MD. Schedule an appointment as soon as possible for a visit in 1 week.   Specialty:  Family Medicine   Contact information:   [address] [place] [address] [phone]        The results of significant diagnostics from this hospitalization (including imaging, microbiology, ancillary and laboratory) are listed below for reference.    Significant Diagnostic  Studies: Dg Chest 2 View  03/13/2015   CLINICAL DATA:  Productive cough and congestion.  Fever.  EXAM: CHEST  2 VIEW  COMPARISON:  None.  FINDINGS: There is consolidation in portions of the anterior segment of the right upper lobe and in the superior segment right lower lobe. Lungs elsewhere clear. Heart size and pulmonary vascularity are normal. No adenopathy. No bone lesions.  IMPRESSION: Areas of airspace consolidation consistent with pneumonia in the anterior segment right upper lobe and in the superior segment right lower lobe.   Electronically Signed   By: Lowella Grip III M.D.   On: 03/13/2015 17:17    Microbiology: Recent Results (from the past 240 hour(s))  Culture, blood (routine x 2)     Status: None (Preliminary result)   Collection Time: 03/13/15  4:35 PM  Result Value Ref Range  Status   Specimen Description BLOOD LEFT AC  Final   Special Requests BOTTLES DRAWN AEROBIC AND ANAEROBIC [place]  Final   Culture   Final    NO GROWTH 2 DAYS Performed at [facility]    Report Status PENDING  Incomplete  Culture, blood (routine x 2)     Status: None (Preliminary result)   Collection Time: 03/13/15  6:40 PM  Result Value Ref Range Status   Specimen Description BLOOD RIGHT AC  Final   Special Requests BOTTLES DRAWN AEROBIC AND ANAEROBIC 4CC EACH  Final   Culture   Final    NO GROWTH 2 DAYS Performed at [facility]    Report Status PENDING  Incomplete  Respiratory virus panel     Status: None   Collection Time: 03/13/15 10:33 PM  Result Value Ref Range Status   Respiratory Syncytial Virus A Negative Negative Final   Respiratory Syncytial Virus B Negative Negative Final   Influenza A Negative Negative Final   Influenza B Negative Negative Final   Parainfluenza 1 Negative Negative Final   Parainfluenza 2 Negative Negative Final   Parainfluenza 3 Negative Negative Final   Metapneumovirus Negative Negative Final   Rhinovirus Negative Negative Final    Adenovirus Negative Negative Final    Comment: (NOTE) Performed At: [facility] [place], [place] [id number] Lindon Romp MD BX:[id number]      Labs: Basic Metabolic Panel:  Recent Labs Lab 03/13/15 1630 03/14/15 0926 03/15/15 0542 03/15/15 1023 03/16/15 0607  NA 137 139 139  --  137  K 3.1* 2.2* 4.9  --  4.2  CL 101 112* 113*  --  110  CO2 23 19* 21*  --  22  GLUCOSE 107* 135* 94  --  94  BUN 13 7 <5*  --  <5*  CREATININE 0.94 0.95 0.77  --  0.84  CALCIUM 9.0 7.5* 7.6*  --  8.2*  MG  --  1.7  --  2.3 2.2   Liver Function Tests:  Recent Labs Lab 03/13/15 1630 03/14/15 0926  AST 47* 28  ALT 22 13*  ALKPHOS 85 63  BILITOT 0.4 0.3  PROT 8.0 5.7*  ALBUMIN 3.7 2.5*    Recent Labs Lab 03/13/15 1630  LIPASE 11*   No results for input(s): AMMONIA in the last 168 hours. CBC:  Recent Labs Lab 03/13/15 1630 03/14/15 0926 03/15/15 0542 03/16/15 0607  WBC 8.0 4.1 2.8* 2.4*  NEUTROABS 6.3 2.5  --  0.5*  HGB 13.6 11.5* 10.9* 12.0  HCT 41.6 36.0 34.1* 36.9  MCV 90.0 90.0 91.7 91.1  PLT 331 302 312 406*   Cardiac Enzymes: No results for input(s): CKTOTAL, CKMB, CKMBINDEX, TROPONINI in the last 168 hours. BNP: BNP (last 3 results) No results for input(s): BNP in the last 8760 hours.  ProBNP (last 3 results) No results for input(s): PROBNP in the last 8760 hours.  CBG: No results for input(s): GLUCAP in the last 168 hours.     SignedIrine Seal  MD [facility] 03/16/2015, 12:02 PM

## 2015-03-16 NOTE — Progress Notes (Signed)
Pt discharging to home, vitals stable, IV dc'd. Discharge education reviewed with pt and all questions addressed. Pt received 2 rx's to be filled. 03/16/2015 1:31 PM Levelle Edelen

## 2015-03-16 NOTE — Care Management Note (Signed)
Case Management Note  Patient Details  Name: Helen Sanchez MRN: [id number] Date of Birth: Mar 24, 1981  Subjective/Objective:     Lives with spouse, pta indep.  Patient for dc today, no needs.                Action/Plan:   Expected Discharge Date:                  Expected Discharge Plan:  Home/Self Care  In-House Referral:     Discharge planning Services  CM Consult  Post Acute Care Choice:    Choice offered to:     DME Arranged:    DME Agency:     HH Arranged:    HH Agency:     Status of Service:  Completed, signed off  Medicare Important Message Given:    Date Medicare IM Given:    Medicare IM give by:    Date Additional Medicare IM Given:    Additional Medicare Important Message give by:     If discussed at Long Length of Stay Meetings, dates discussed:    Additional Comments:  Leone Haven, RN 03/16/2015, 11:27 AM

## 2015-03-18 LAB — CULTURE, BLOOD (ROUTINE X 2)
Culture: NO GROWTH
Culture: NO GROWTH

## 2015-04-21 ENCOUNTER — Encounter: Payer: Self-pay | Admitting: Emergency Medicine

## 2015-04-21 ENCOUNTER — Emergency Department
Admission: EM | Admit: 2015-04-21 | Discharge: 2015-04-21 | Disposition: A | Discharge status: Home / Self Care | Payer: 59 | Source: Home / Self Care | Attending: Family Medicine | Admitting: Family Medicine

## 2015-04-21 ENCOUNTER — Emergency Department (INDEPENDENT_AMBULATORY_CARE_PROVIDER_SITE_OTHER): Payer: 59

## 2015-04-21 DIAGNOSIS — Z09 Encounter for follow-up examination after completed treatment for conditions other than malignant neoplasm: Secondary | ICD-10-CM | POA: Diagnosis not present

## 2015-04-21 DIAGNOSIS — Z8701 Personal history of pneumonia (recurrent): Secondary | ICD-10-CM | POA: Diagnosis not present

## 2015-04-21 NOTE — ED Notes (Signed)
Follow up Chest x-ray

## 2015-04-21 NOTE — ED Provider Notes (Signed)
CSN: [id number]     Arrival date & time 04/21/15  0830 History   None    Chief Complaint  Patient presents with  . Follow-up   (Consider location/radiation/quality/duration/timing/severity/associated sxs/prior Treatment) HPI Pt is a 34yo female presenting to [facility] for f/u CXR after being admitted on 03/13/15 for sepsis secondary to CAP.  Pt was seen on 03/23/15 by her PCP for f/u after initial admission.  Pt was advised to have a f/u CXR 1 month after admission to make sure pneumonia has cleared. She has completed her antibiotics. Denies cough, congestion, chest pain, n/v/d. Pt states she feels well.  Past Medical History  Diagnosis Date  . Hypertension   . CAP (community acquired pneumonia) 03/13/2015  . Complication of anesthesia     "I usually require more of it" (03/13/2015)  . Anemia     "when I was a kid"  . Migraine     "probably 2/wk" (03/13/2015)  . Chronic headache     "weekly" (03/13/2015)  . Chronic lower back pain   . Anxiety    Past Surgical History  Procedure Laterality Date  . Refractive surgery Bilateral ~ 2005  . Cesarean section  2015   No family history on file. Social History  Substance Use Topics  . Smoking status: Never Smoker   . Smokeless tobacco: Never Used  . Alcohol Use: No   OB History    No data available     Review of Systems  Constitutional: Negative for fever and chills.  HENT: Negative for congestion, ear pain, sore throat, trouble swallowing and voice change.   Respiratory: Negative for cough, chest tightness, shortness of breath, wheezing and stridor.   Cardiovascular: Negative for chest pain and palpitations.  Gastrointestinal: Negative for nausea, vomiting, abdominal pain and diarrhea.  Musculoskeletal: Negative for myalgias, back pain and arthralgias.  All other systems reviewed and are negative.   Allergies  Sulfa antibiotics  Home Medications   Prior to Admission medications   Medication Sig Start Date End Date Taking?  Authorizing Provider  acetaminophen (TYLENOL) 500 MG tablet Take 1,000 mg by mouth 3 (three) times daily as needed for mild pain.    Historical Provider, MD  ALPRAZolam Prudy Feeler) 0.5 MG tablet Take 0.5 mg by mouth 3 (three) times daily as needed for anxiety.  03/07/15   Historical Provider, MD  carisoprodol (SOMA) 350 MG tablet Take 350 mg by mouth 3 (three) times daily as needed for muscle spasms.  02/10/15   Historical Provider, MD  eletriptan (RELPAX) 40 MG tablet Take 40 mg by mouth daily as needed for migraine.  02/10/15   Historical Provider, MD  guaiFENesin (MUCINEX) 600 MG 12 hr tablet Take 2 tablets (1,200 mg total) by mouth 2 (two) times daily. Take for 4 days then stop. 03/16/15   Rodolph Bong, MD  ibuprofen (ADVIL,MOTRIN) 200 MG tablet Take 400-800 mg by mouth 2 (two) times daily as needed for moderate pain.    Historical Provider, MD  levofloxacin (LEVAQUIN) 750 MG tablet Take 1 tablet (750 mg total) by mouth daily. Take for 4 days then stop. 03/17/15   Rodolph Bong, MD  loperamide (IMODIUM A-D) 2 MG tablet Take 2 mg by mouth every 6 (six) hours as needed for diarrhea or loose stools.    Historical Provider, MD  promethazine (PHENERGAN) 25 MG tablet Take 25 mg by mouth every 6 (six) hours as needed for nausea or vomiting.    Historical Provider, MD  propranolol ER (INDERAL  LA) 80 MG 24 hr capsule Take 80 mg by mouth daily. 02/10/15 02/10/16  Historical Provider, MD  rizatriptan (MAXALT) 10 MG tablet Take 10 mg by mouth daily as needed for migraine. May repeat in 2 hours, if needed. 02/10/15 02/11/16  Historical Provider, MD  sertraline (ZOLOFT) 100 MG tablet Take 100 mg by mouth daily.    Historical Provider, MD  traMADol (ULTRAM) 50 MG tablet Take 50 mg by mouth every 6 (six) hours as needed for moderate pain.  03/02/15   Historical Provider, MD   Meds Ordered and Administered this Visit  Medications - No data to display  BP 135/89 mmHg  Pulse 72  Temp(Src) 98.2 F (36.8 C) (Oral)  Ht  5\' 3"  (1.6 m)  Wt 142 lb (64.411 kg)  BMI 25.16 kg/m2  SpO2 100%  LMP 04/14/2015 No data found.   Physical Exam  Constitutional: She appears well-developed and well-nourished. No distress.  HENT:  Head: Normocephalic and atraumatic.  Eyes: Conjunctivae are normal. No scleral icterus.  Neck: Normal range of motion.  Cardiovascular: Normal rate, regular rhythm and normal heart sounds.   Pulmonary/Chest: Effort normal and breath sounds normal. No respiratory distress. She has no wheezes. She has no rales. She exhibits no tenderness.  Abdominal: Soft. She exhibits no distension. There is no tenderness.  Musculoskeletal: Normal range of motion.  Neurological: She is alert.  Skin: Skin is warm and dry. She is not diaphoretic.  Nursing note and vitals reviewed.   ED Course  Procedures (including critical care time)  Labs Review Labs Reviewed - No data to display  Imaging Review Dg Chest 2 View  04/21/2015   CLINICAL DATA:  Post pneumonia admission follow-up  EXAM: CHEST  2 VIEW  COMPARISON:  None.  FINDINGS: Normal heart size and mediastinal contours. No acute infiltrate or edema. No effusion or pneumothorax. No acute osseous findings.  IMPRESSION: Clear chest.   Electronically Signed   By: Marnee Spring M.D.   On: 04/21/2015 09:32     MDM   1. Follow up    Pt here for f/u CXR following admission for CAP 1 month ago. Pt is asymptomatic today. BP elevated in triage: 158/108, improved to 135/89 while in UC.  CXR: clear  Informed pt of CXR results.  F/u with PCP as needed for ongoing healthcare needs. Patient verbalized understanding and agreement with treatment plan.    Junius Finner, PA-C 04/21/15 [phone]

## 2015-08-17 DIAGNOSIS — H10022 Other mucopurulent conjunctivitis, left eye: Secondary | ICD-10-CM | POA: Diagnosis not present

## 2015-08-25 MED FILL — PROPRANOLOL ER 80 MG CAP: 80 | 90 days supply | Qty: 90 | Fill #1

## 2015-09-07 MED FILL — RELPAX 40 MG TABLET: 40 | 30 days supply | Qty: 9 | Fill #2

## 2015-10-13 MED FILL — RELPAX 40 MG TABLET: 40 | 30 days supply | Qty: 9 | Fill #3

## 2015-10-15 MED FILL — AMPHETAMINE SALTS 10 MG TAB: 10 | 30 days supply | Qty: 60 | Fill #0

## 2015-11-12 MED FILL — RELPAX 40 MG TABLET: 40 | 30 days supply | Qty: 9 | Fill #4

## 2015-11-12 MED FILL — AMPHETAMINE SALTS 10 MG TAB: 10 | 30 days supply | Qty: 60 | Fill #0

## 2015-11-24 MED FILL — PROPRANOLOL ER 80 MG CAP: 80 | 30 days supply | Qty: 30 | Fill #2

## 2015-12-09 MED FILL — DEXTROAMP-AMP 10 MG TAB: 10 | 30 days supply | Qty: 60 | Fill #0

## 2015-12-10 MED FILL — RELPAX 40 MG TABLET: 40 | 30 days supply | Qty: 9 | Fill #5

## 2015-12-25 MED FILL — PROPRANOLOL ER 80 MG CAP: 80 | 90 days supply | Qty: 90 | Fill #0

## 2016-01-05 MED FILL — RELPAX 40 MG TABLET: 40 | 30 days supply | Qty: 9 | Fill #6

## 2016-01-06 MED FILL — DEXTROAMP-AMP 10 MG TAB: 10 | 30 days supply | Qty: 60 | Fill #0

## 2016-01-13 DIAGNOSIS — G43719 Chronic migraine without aura, intractable, without status migrainosus: Secondary | ICD-10-CM | POA: Diagnosis not present

## 2016-01-13 DIAGNOSIS — F419 Anxiety disorder, unspecified: Secondary | ICD-10-CM | POA: Diagnosis not present

## 2016-01-13 DIAGNOSIS — G43709 Chronic migraine without aura, not intractable, without status migrainosus: Secondary | ICD-10-CM | POA: Diagnosis not present

## 2016-01-13 DIAGNOSIS — Z79899 Other long term (current) drug therapy: Secondary | ICD-10-CM | POA: Diagnosis not present

## 2016-01-13 DIAGNOSIS — I10 Essential (primary) hypertension: Secondary | ICD-10-CM | POA: Diagnosis not present

## 2016-01-13 DIAGNOSIS — Z0000 Encounter for general adult medical examination without abnormal findings: Secondary | ICD-10-CM | POA: Diagnosis not present

## 2016-01-13 DIAGNOSIS — M5441 Lumbago with sciatica, right side: Secondary | ICD-10-CM | POA: Diagnosis not present

## 2016-01-13 DIAGNOSIS — M5442 Lumbago with sciatica, left side: Secondary | ICD-10-CM | POA: Diagnosis not present

## 2016-01-13 DIAGNOSIS — F988 Other specified behavioral and emotional disorders with onset usually occurring in childhood and adolescence: Secondary | ICD-10-CM | POA: Diagnosis not present

## 2016-02-24 DIAGNOSIS — E039 Hypothyroidism, unspecified: Secondary | ICD-10-CM | POA: Diagnosis not present

## 2016-02-24 DIAGNOSIS — I10 Essential (primary) hypertension: Secondary | ICD-10-CM | POA: Diagnosis not present

## 2017-04-27 IMAGING — CR DG CHEST 2V
2 series · 2 of 2 positions shown · non-contrast
Comparison: None.

CLINICAL DATA: Productive cough and congestion.  Fever.

EXAM:
CHEST  2 VIEW

[w chest pa]
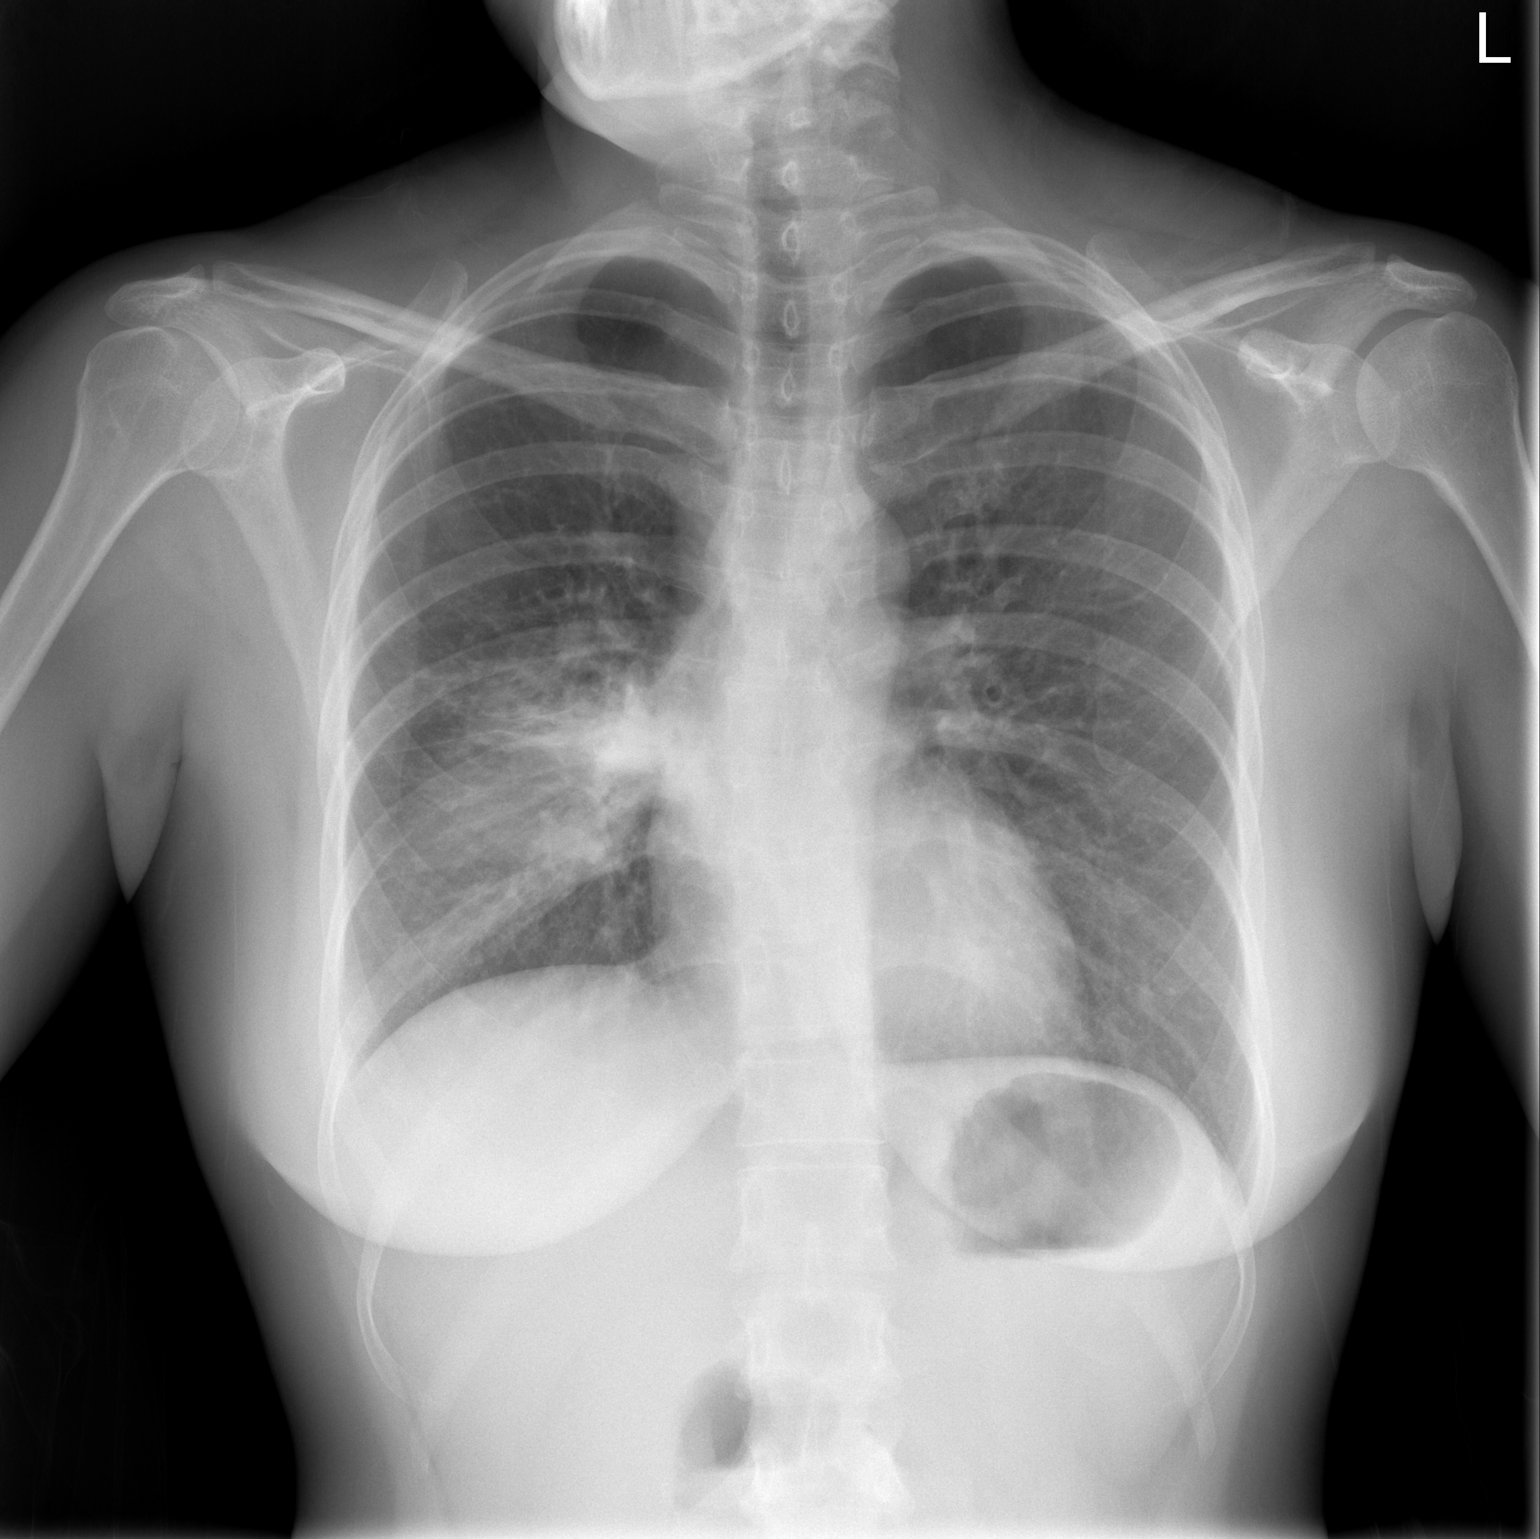

[w chest lat]
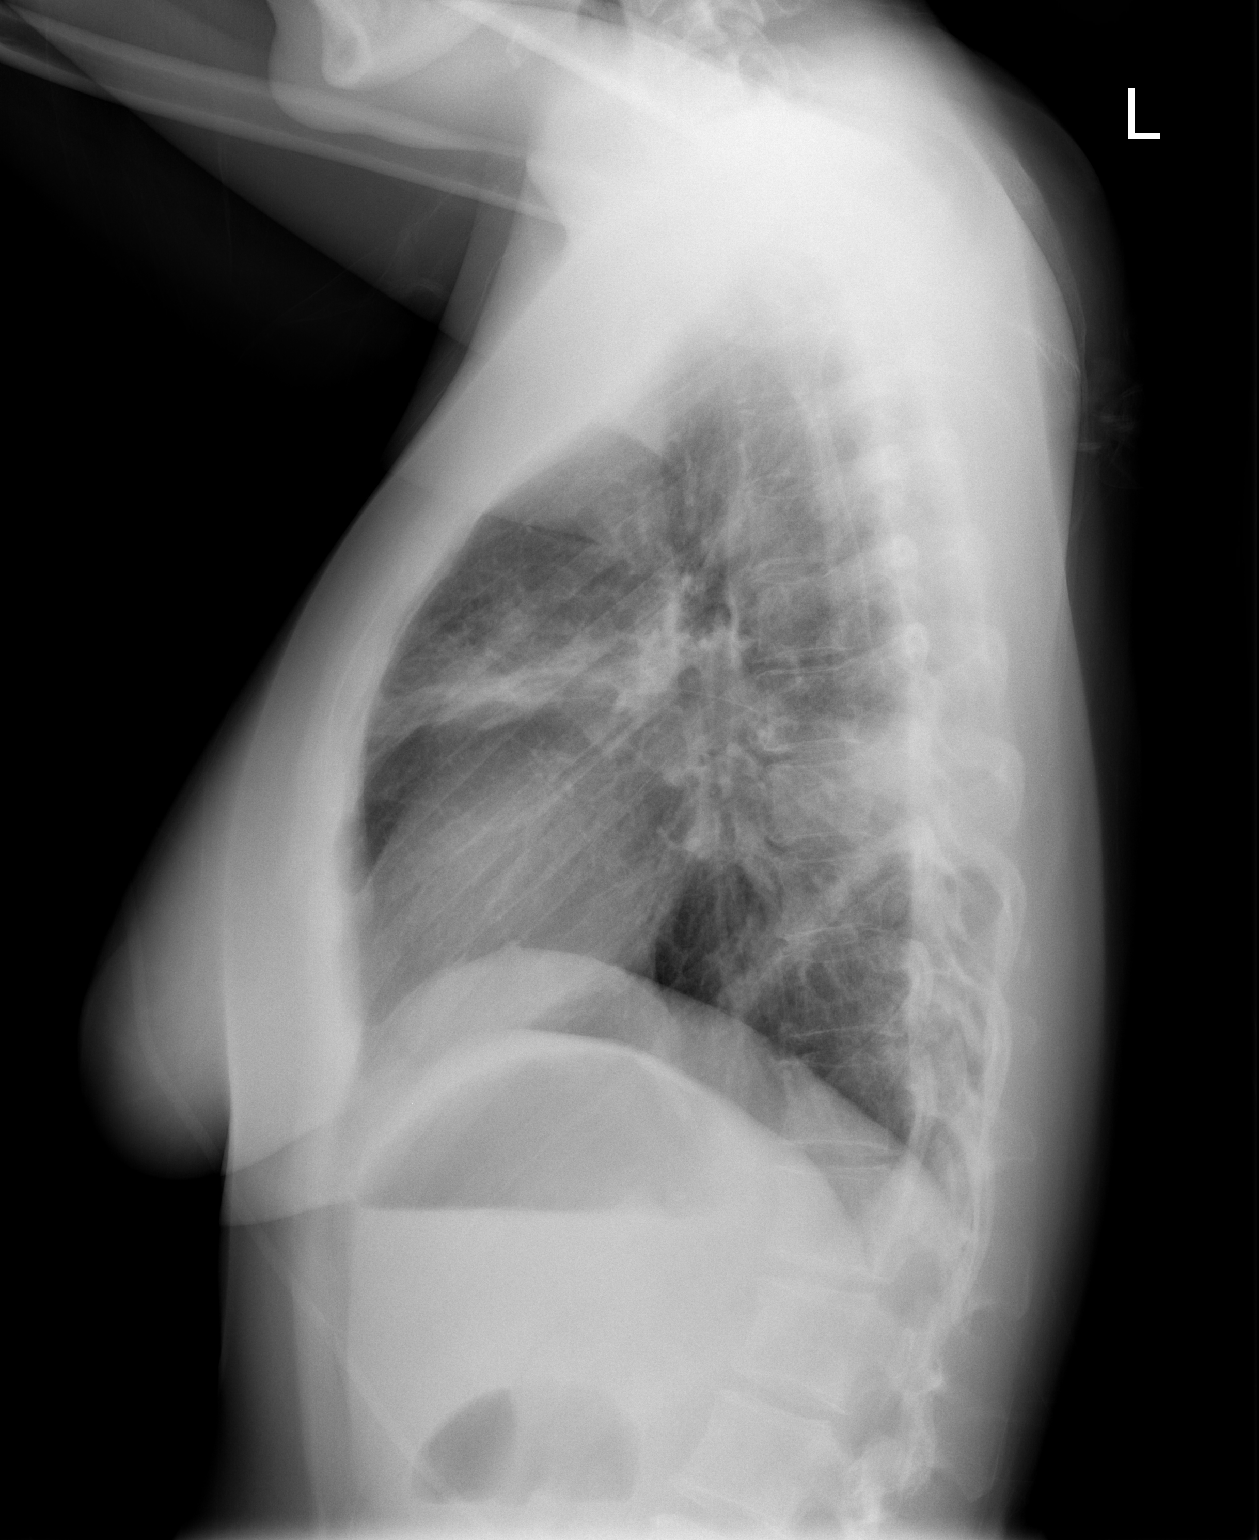

[2 of 2 positions shown; findings below may reference images not displayed]

FINDINGS: There is consolidation in portions of the anterior segment of the
right upper lobe and in the superior segment right lower lobe. Lungs
elsewhere clear. Heart size and pulmonary vascularity are normal. No
adenopathy. No bone lesions.
IMPRESSION: Areas of airspace consolidation consistent with pneumonia in the
anterior segment right upper lobe and in the superior segment right
lower lobe.
